# Patient Record
Sex: Male | Born: 2000 | Race: White | Hispanic: No | Marital: Single | Smoking: Never smoker
Health system: Southern US, Community
[De-identification: ages and names within clinical notes are randomized; demographics above are authoritative.]

## PROBLEM LIST (undated history)

## (undated) DIAGNOSIS — F909 Attention-deficit hyperactivity disorder, unspecified type: Secondary | ICD-10-CM

---
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
## 2020-01-03 ENCOUNTER — Encounter (INDEPENDENT_AMBULATORY_CARE_PROVIDER_SITE_OTHER): Payer: Self-pay

## 2020-01-04 ENCOUNTER — Ambulatory Visit: Payer: Self-pay

## 2020-01-07 ENCOUNTER — Encounter: Payer: Self-pay | Admitting: Physical Therapy

## 2020-01-07 ENCOUNTER — Ambulatory Visit: Payer: Self-pay | Admitting: Physical Therapy

## 2020-01-07 ENCOUNTER — Other Ambulatory Visit: Payer: Self-pay

## 2020-01-07 DIAGNOSIS — R29898 Other symptoms and signs involving the musculoskeletal system: Secondary | ICD-10-CM

## 2020-01-07 DIAGNOSIS — M25572 Pain in left ankle and joints of left foot: Secondary | ICD-10-CM

## 2020-01-07 DIAGNOSIS — M25672 Stiffness of left ankle, not elsewhere classified: Secondary | ICD-10-CM

## 2020-01-07 DIAGNOSIS — R262 Difficulty in walking, not elsewhere classified: Secondary | ICD-10-CM

## 2020-01-07 DIAGNOSIS — M25662 Stiffness of left knee, not elsewhere classified: Secondary | ICD-10-CM

## 2020-01-07 DIAGNOSIS — M25562 Pain in left knee: Secondary | ICD-10-CM

## 2020-01-07 NOTE — Therapy (Signed)
[facility] [address]  [address] [place], [place], [address] Phone: [phone]   Fax:  [phone]  Physical Therapy Treatment  Patient Details  Name: Calvin Castaneda MRN: [id number] Date of Birth: [birth year] Referring Provider (PT): Jari Sportsman, [place]   Encounter Date: 01/07/2020   PT End of Session - 01/07/20 1144    Visit Number 7    Number of Visits 13    Date for PT Re-Evaluation 01/18/20    Authorization Type UHC Medicaid    PT Start Time 1105    PT Stop Time 1143    PT Time Calculation (min) 38 min    Activity Tolerance Patient tolerated treatment well    Behavior During Therapy [facility] for tasks assessed/performed           Past Medical History:  Diagnosis Date  . ADHD     Past Surgical History:  Procedure Laterality Date  . TIBIA IM NAIL INSERTION Left 10/15/2019   Procedure: INTRAMEDULLARY (IM) NAIL TIBIAL;  Surgeon: Tarry Kos, MD;  [facility]: [facility] OR;  Service: Orthopedics;  Laterality: Left;    There were no vitals filed for this visit.   Subjective Assessment - 01/07/20 1107    Subjective Has gone to the gym 3x to work on UE strengthening and light LE strengthening. Still having a little bit of pain over the shin with HS curls. Stepped off a curb and jarred his L knee the other day but without any lasting effects.    Pertinent History ADHD    Diagnostic tests 11/24/19 L tibia/fibular xray: X-rays demonstrate stable alignment of the fractures with considerable callus formation    Patient Stated Goals "be able to walk so that I can run"    Currently in Pain? No/denies                             [facility] Adult PT Treatment/Exercise - 01/07/20 0001      Knee/Hip Exercises: Stretches   Lobbyist 2 reps;30 seconds    Quad Stretch Limitations prone with strap    Other Knee/Hip Stretches standing L quad stretch with strap 30"      Knee/Hip Exercises: Aerobic   Recumbent Bike L2 x 6  min       Knee/Hip Exercises: Standing   Step Down Left;10 reps;Hand Hold: 1;Step Height: 4";2 sets    Step Down Limitations heel touch   considerable L knee shaking    Functional Squat 1 set;15 reps    Functional Squat Limitations TRX squat with green TB above knees    Wall Squat 1 set;10 reps    Wall Squat Limitations depth to tolerance   improved wt shift   Other Standing Knee Exercises sidestepping with red loop around toes 2x66ft   cues to maintain trunk upright     Ankle Exercises: Standing   Heel Raises Both;10 reps   2 sets; B conc, L ecc, mild discomfort in L knee     Ankle Exercises: Stretches   Gastroc Stretch 2 reps;30 seconds   L LE     Ankle Exercises: Seated   Other Seated Ankle Exercises L ankle DF, INV, EV with green TB x5 each                  PT Education - 01/07/20 1144    Education Details update to HEP    Person(s) Educated Patient  Methods Explanation;Demonstration;Tactile cues;Verbal cues;Handout    Comprehension Verbalized understanding;Returned demonstration            PT Short Term Goals - 12/14/19 1201      PT SHORT TERM GOAL #1   Title Patient to be independent with initial HEP.    Time 3    Period Weeks    Status Achieved    Target Date 12/28/19             PT Long Term Goals - 12/09/19 1358      PT LONG TERM GOAL #1   Title Patient to be independent with advanced HEP.    Time 6    Period Weeks    Status On-going      PT LONG TERM GOAL #2   Title Patient to demonstrate L ankle and knee AROM WFL and without pain limiting.    Time 6    Period Weeks    Status On-going      PT LONG TERM GOAL #3   Title Patient to demonstrate B LE strength >/=4+/5.    Time 6    Period Weeks    Status On-going      PT LONG TERM GOAL #4   Title Patient to demonstrate symmetrical step length, weight shift, knee flexion, and dorsiflexion with ambulation without AD.    Time 6    Period Weeks    Status On-going      PT LONG TERM GOAL  #5   Title Patient to ascend/descend stairs without handrail and with reciprocal pattern with good quad control.    Time 6    Period Weeks    Status On-going                 Plan - 01/07/20 1144    Clinical Impression Statement Patient without new complaints today. Worked on progressive LE strengthening ther-ex this session. Patient tolerated an increase in challenge with heel raises with patient demonstrating some weakness and shaking on the L LE. Demonstrated much improved weight shift and depth with squatting activities. Also tolerated initiation of step downs with patient demonstrating considerable L knee shaking d/t muscle weakness. Mild valgus collapse also noted upon step down. Patient was able to perform 4 way ankle with increased banded resistance today, thus updated HEP with more challenging exercises which were performed today. Patient reported understanding and without complaints at end of session. Patient is progressing well towards goals.    Comorbidities ADHD    Rehab Potential Excellent    PT Frequency 2x / week    PT Treatment/Interventions ADLs/Self Care Home Management;Cryotherapy;Electrical Stimulation;Moist Heat;Balance training;Therapeutic exercise;Therapeutic activities;Functional mobility training;Stair training;Gait training;Ultrasound;Neuromuscular re-education;Patient/family education;Manual techniques;Vasopneumatic Device;Taping;Energy conservation;Dry needling;Passive range of motion;Scar mobilization    PT Next Visit Plan L quad and ankle strengthening ther-ex    Consulted and Agree with Plan of Care Patient           Patient will benefit from skilled therapeutic intervention in order to improve the following deficits and impairments:  Hypomobility, Increased edema, Decreased scar mobility, Decreased activity tolerance, Decreased strength, Pain, Increased fascial restricitons, Decreased balance, Difficulty walking, Increased muscle spasms, Improper body  mechanics, Decreased range of motion, Impaired flexibility, Postural dysfunction  Visit Diagnosis: Pain in left ankle and joints of left foot  Stiffness of left ankle, not elsewhere classified  Acute pain of left knee  Stiffness of left knee, not elsewhere classified  Difficulty in walking, not elsewhere classified  Other symptoms and signs involving the  musculoskeletal system     Problem List Patient Active Problem List   Diagnosis Date Noted  . Tibia/fibula fracture, left, closed, initial encounter 10/14/2019     Anette Guarneri, PT, DPT 01/07/20 11:49 AM   [facility] [address]  [address] [place], [place], [address] Phone: [phone]   Fax:  [phone]  Name: Calvin Castaneda MRN: [id number] Date of Birth: [birth year]

## 2020-01-11 ENCOUNTER — Other Ambulatory Visit: Payer: Self-pay

## 2020-01-11 ENCOUNTER — Ambulatory Visit: Payer: Self-pay

## 2020-01-11 DIAGNOSIS — R262 Difficulty in walking, not elsewhere classified: Secondary | ICD-10-CM

## 2020-01-11 DIAGNOSIS — M25572 Pain in left ankle and joints of left foot: Secondary | ICD-10-CM

## 2020-01-11 DIAGNOSIS — R29898 Other symptoms and signs involving the musculoskeletal system: Secondary | ICD-10-CM

## 2020-01-11 DIAGNOSIS — M25672 Stiffness of left ankle, not elsewhere classified: Secondary | ICD-10-CM

## 2020-01-11 DIAGNOSIS — M25662 Stiffness of left knee, not elsewhere classified: Secondary | ICD-10-CM

## 2020-01-11 DIAGNOSIS — M25562 Pain in left knee: Secondary | ICD-10-CM

## 2020-01-11 NOTE — Therapy (Signed)
[facility] [address]  [place], [place], [address] Phone: [phone]   Fax:  [phone]  Physical Therapy Treatment  Patient Details  Name: Calvin Castaneda MRN: [id number] Date of Birth: February 17, 2001 Referring Provider (PT): Dwana Melena, [place]   Encounter Date: 01/11/2020   PT End of Session - 01/11/20 1116    Visit Number 8    Number of Visits 13    Date for PT Re-Evaluation 01/18/20    Authorization Type UHC Medicaid    PT Start Time 1108   pt. arrived late   PT Stop Time 1159    PT Time Calculation (min) 51 min    Activity Tolerance Patient tolerated treatment well    Behavior During Therapy [facility] for tasks assessed/performed           Past Medical History:  Diagnosis Date  . ADHD     Past Surgical History:  Procedure Laterality Date  . TIBIA IM NAIL INSERTION Left 10/15/2019   Procedure: INTRAMEDULLARY (IM) NAIL TIBIAL;  Surgeon: Leandrew Koyanagi, MD;  Location: [facility];  Service: Orthopedics;  Laterality: Left;    There were no vitals filed for this visit.   Subjective Assessment - 01/11/20 1115    Subjective Doing well with no new complaints.    Pertinent History ADHD    Diagnostic tests 11/24/19 L tibia/fibular xray: X-rays demonstrate stable alignment of the fractures with considerable callus formation    Patient Stated Goals "be able to walk so that I can run"    Currently in Pain? No/denies    Pain Score 0-No pain    Multiple Pain Sites No              [facility] PT Assessment - 01/11/20 0001      Assessment   Medical Diagnosis L Tibia/fibula fx    Referring Provider (PT) Dwana Melena, PA-C    Onset Date/Surgical Date 10/15/19    Next MD Visit 01/12/20      AROM   Right/Left Knee Right;Left    Left Knee Extension 0    Left Knee Flexion 134    Right/Left Ankle Right;Left    Right Ankle Dorsiflexion 19    Left Ankle Dorsiflexion 10    Left Ankle Plantar Flexion 60    Left Ankle  Inversion 35    Left Ankle Eversion 25      Strength   Strength Assessment Site Hip;Knee;Ankle    Right/Left Hip Right;Left    Right Hip Flexion 5/5    Right Hip Extension 4+/5    Right Hip ABduction 5/5    Right Hip ADduction 4+/5    Left Hip Flexion 5/5    Left Hip Extension 4+/5    Left Hip ABduction 4+/5    Left Hip ADduction 4+/5    Right/Left Knee Right;Left    Right Knee Flexion 5/5    Right Knee Extension 5/5    Left Knee Flexion 4+/5    Left Knee Extension 4+/5    Right/Left Ankle Right;Left    Right Ankle Dorsiflexion 5/5    Right Ankle Plantar Flexion 5/5    Right Ankle Inversion 5/5    Right Ankle Eversion 5/5    Left Ankle Dorsiflexion 5/5    Left Ankle Plantar Flexion 5/5    Left Ankle Inversion 5/5    Left Ankle Eversion 5/5  [facility] Adult PT Treatment/Exercise - 01/11/20 0001      Knee/Hip Exercises: Stretches   Passive Hamstring Stretch Left;1 rep;30 seconds    Quad Stretch 2 reps;30 seconds    Quad Stretch Limitations prone with strap and bolster       Knee/Hip Exercises: Aerobic   Elliptical Lvl 3.0, 7 min       Knee/Hip Exercises: Machines for Strengthening   Cybex Leg Press L only: 25# x 15 reps      Knee/Hip Exercises: Standing   Step Down Left;10 reps;Hand Hold: 1;2 sets;Step Height: 6"    Step Down Limitations heel touch    SLS with Vectors L RDL x 5 reps                     PT Short Term Goals - 12/14/19 1201      PT SHORT TERM GOAL #1   Title Patient to be independent with initial HEP.    Time 3    Period Weeks    Status Achieved    Target Date 12/28/19             PT Long Term Goals - 01/11/20 1126      PT LONG TERM GOAL #1   Title Patient to be independent with advanced HEP.    Time 6    Period Weeks    Status Partially Met   01/11/20 met for current     PT LONG TERM GOAL #2   Title Patient to demonstrate L ankle and knee AROM WFL and without pain limiting.    Time 6     Period Weeks    Status Partially Met   01/11/20: met for L knee AROM; met for all L ankle AROM with exception of L DF     PT LONG TERM GOAL #3   Title Patient to demonstrate B LE strength >/=4+/5.    Time 6    Period Weeks    Status Achieved   01/11/20     PT LONG TERM GOAL #4   Title Patient to demonstrate symmetrical step length, weight shift, knee flexion, and dorsiflexion with ambulation without AD.    Time 6    Period Weeks    Status Partially Met   01/11/20: improved gait mechanics however some remaining limitation with L LE weight shift     PT LONG TERM GOAL #5   Title Patient to ascend/descend stairs without handrail and with reciprocal pattern with good quad control.    Time 6    Period Weeks    Status Partially Met   01/11/20; ambulating with reciprocal pattern however with limited quad control                Plan - 01/11/20 1122    Clinical Impression Statement Pt. has made good progress with physical therapy.  Now ambulating without AD and reports daily adherence to HEP.  LTG #1 partially met.  Pt. has partially met LTG #2 demonstrating L knee AROM WFL and improved L ankle AROM now only limited in DF ROM.  Pt. encouraged to continue daily calf stretching with HEP for improved ankle mobility.  Pt. has met LTG #3 as she demonstrates 4+/5-5/5 strength in B LEs with MMT.  Pt. has demonstrated much improved gait mechanics with improved B step length and posture however still with some limited L LE weight shift.  LTG #4 partially achieved.  Pt. has partially achieved LTG #5 navigating stairs reciprocally however  with limited L quad control and visible remaining calf muscle tightness contributing to poor mechanics descending.  Session focused training for eccentric quad/HS control which was tolerated well.  Pt. to see MD for f/u tomorrow morning.  Isiah will continue to benefit from further skilled therapy to maximize functional strength, ROM and return to recreational activity.      Comorbidities ADHD    Rehab Potential Excellent    PT Frequency 2x / week    PT Duration 6 weeks    PT Treatment/Interventions ADLs/Self Care Home Management;Cryotherapy;Electrical Stimulation;Moist Heat;Balance training;Therapeutic exercise;Therapeutic activities;Functional mobility training;Stair training;Gait training;Ultrasound;Neuromuscular re-education;Patient/family education;Manual techniques;Vasopneumatic Device;Taping;Energy conservation;Dry needling;Passive range of motion;Scar mobilization    PT Next Visit Plan L quad and ankle strengthening ther-ex           Patient will benefit from skilled therapeutic intervention in order to improve the following deficits and impairments:  Hypomobility, Increased edema, Decreased scar mobility, Decreased activity tolerance, Decreased strength, Pain, Increased fascial restricitons, Decreased balance, Difficulty walking, Increased muscle spasms, Improper body mechanics, Decreased range of motion, Impaired flexibility, Postural dysfunction  Visit Diagnosis: Pain in left ankle and joints of left foot  Stiffness of left ankle, not elsewhere classified  Acute pain of left knee  Stiffness of left knee, not elsewhere classified  Difficulty in walking, not elsewhere classified  Other symptoms and signs involving the musculoskeletal system     Problem List Patient Active Problem List   Diagnosis Date Noted  . Tibia/fibula fracture, left, closed, initial encounter 10/14/2019    Bess Harvest, PTA 01/11/20 1:14 PM   [facility] [address]  [place], [place], [address] Phone: [phone]   Fax:  [phone]  Name: Keyandre Pileggi MRN: [id number] Date of Birth: April 03, 2001

## 2020-01-12 ENCOUNTER — Ambulatory Visit (INDEPENDENT_AMBULATORY_CARE_PROVIDER_SITE_OTHER): Payer: Medicaid Other | Admitting: Orthopaedic Surgery

## 2020-01-12 ENCOUNTER — Ambulatory Visit (INDEPENDENT_AMBULATORY_CARE_PROVIDER_SITE_OTHER): Payer: Self-pay

## 2020-01-12 ENCOUNTER — Encounter: Payer: Self-pay | Admitting: Orthopaedic Surgery

## 2020-01-12 DIAGNOSIS — S82202A Unspecified fracture of shaft of left tibia, initial encounter for closed fracture: Secondary | ICD-10-CM

## 2020-01-12 DIAGNOSIS — S82402A Unspecified fracture of shaft of left fibula, initial encounter for closed fracture: Secondary | ICD-10-CM

## 2020-01-12 NOTE — Progress Notes (Signed)
   Post-Op Visit Note   Patient: Calvin Castaneda           Date of Birth: 2000/06/19           MRN: [id number] Visit Date: 01/12/2020 PCP: Marlena Clipper, MD   Assessment & Plan:  Chief Complaint:  Chief Complaint  Patient presents with  . Left Leg - Follow-up   Visit Diagnoses:  1. Tibia/fibula fracture, left, closed, initial encounter     Plan: Patient is a very pleasant 19 year old who comes in today with his mom. He is 12 weeks status post left tibial IM nail 10/15/2019. Has been doing very good. He has minimal pain which only occurs with hamstring curls. Examination of his left knee reveals range of motion for about 5 degrees of extension to 115 degrees of flexion. There is no effusion. He has 4 out of 5 strength with resisted straight leg raise. He is neurovascular intact distally. At this point, he will continue with physical therapy to work on range of motion strengthening. We will continue to advance with activity as tolerated as his fracture has considerable callus formation. We will follow up with [place] in 2 months time for repeat evaluation and 2 view x-rays of the left tibia/fibula.  Follow-Up Instructions: Return in about 2 months (around 03/13/2020).   Orders:  Orders Placed This Encounter  Procedures  . XR Tibia/Fibula Left   No orders of the defined types were placed in this encounter.   Imaging: XR Tibia/Fibula Left  Result Date: 01/12/2020 X-rays demonstrate stable alignment of the fractures with considerable callus formation and bony consolidation   PMFS History: Patient Active Problem List   Diagnosis Date Noted  . Tibia/fibula fracture, left, closed, initial encounter 10/14/2019   Past Medical History:  Diagnosis Date  . ADHD     History reviewed. No pertinent family history.  Past Surgical History:  Procedure Laterality Date  . TIBIA IM NAIL INSERTION Left 10/15/2019   Procedure: INTRAMEDULLARY (IM) NAIL TIBIAL;  Surgeon: Tarry Kos, MD;   [facility]: [facility];  Service: Orthopedics;  Laterality: Left;   Social History   Occupational History  . Not on file  Tobacco Use  . Smoking status: Never Smoker  . Smokeless tobacco: Never Used  Substance and Sexual Activity  . Alcohol use: No  . Drug use: No  . Sexual activity: Not on file

## 2020-01-14 ENCOUNTER — Other Ambulatory Visit: Payer: Self-pay

## 2020-01-14 ENCOUNTER — Ambulatory Visit: Payer: Self-pay

## 2020-01-14 DIAGNOSIS — R29898 Other symptoms and signs involving the musculoskeletal system: Secondary | ICD-10-CM

## 2020-01-14 DIAGNOSIS — M25672 Stiffness of left ankle, not elsewhere classified: Secondary | ICD-10-CM

## 2020-01-14 DIAGNOSIS — M25662 Stiffness of left knee, not elsewhere classified: Secondary | ICD-10-CM

## 2020-01-14 DIAGNOSIS — M25562 Pain in left knee: Secondary | ICD-10-CM

## 2020-01-14 DIAGNOSIS — M25572 Pain in left ankle and joints of left foot: Secondary | ICD-10-CM

## 2020-01-14 DIAGNOSIS — R262 Difficulty in walking, not elsewhere classified: Secondary | ICD-10-CM

## 2020-01-14 NOTE — Therapy (Signed)
[facility] [address]  [place], [place], [address] Phone: [phone]   Fax:  [phone]  Physical Therapy Treatment  Patient Details  Name: Calvin Castaneda MRN: [id number] Date of Birth: 12-Aug-2000 Referring Provider (PT): Dwana Melena, [place]   Encounter Date: 01/14/2020   PT End of Session - 01/14/20 1108    Visit Number 9    Number of Visits 13    Date for PT Re-Evaluation 01/18/20    Authorization Type [employer] Medicaid    PT Start Time 1105    PT Stop Time 1145    PT Time Calculation (min) 40 min    Activity Tolerance Patient tolerated treatment well    Behavior During Therapy [facility] for tasks assessed/performed           Past Medical History:  Diagnosis Date  . ADHD     Past Surgical History:  Procedure Laterality Date  . TIBIA IM NAIL INSERTION Left 10/15/2019   Procedure: INTRAMEDULLARY (IM) NAIL TIBIAL;  Surgeon: Leandrew Koyanagi, MD;  Location: [place];  Service: Orthopedics;  Laterality: Left;    There were no vitals filed for this visit.   Subjective Assessment - 01/14/20 1106    Subjective Doing ok with no new complaints.    Pertinent History ADHD    Diagnostic tests 11/24/19 L tibia/fibular xray: X-rays demonstrate stable alignment of the fractures with considerable callus formation    Patient Stated Goals "be able to walk so that I can run"    Currently in Pain? No/denies    Pain Score 0-No pain    Multiple Pain Sites No                             [facility] Adult PT Treatment/Exercise - 01/14/20 0001      Knee/Hip Exercises: Aerobic   Tread Mill Backwards TM walking with machine off 3 x 20 sec       Knee/Hip Exercises: Machines for Strengthening   Cybex Leg Press B LE: 45# x 15 reps      Knee/Hip Exercises: Standing   Hip Flexion Right;Left;10 reps;Knee straight    Hip Flexion Limitations red TB at ankle    Hip ADduction Right;Left;10 reps;Strengthening    Hip ADduction  Limitations red TB at ankle    Hip Abduction Right;Left;10 reps;Knee straight;Stengthening    Abduction Limitations red TB at ankle    Hip Extension Left;10 reps;Both;Knee straight;Stengthening    Extension Limitations red TB at ankle    SLS with Vectors L RDL x 10 reps     Walking with Sports Cord All directions mod resistance x 90 ft in clinic       Knee/Hip Exercises: Prone   Other Prone Exercises Prone plank + alternating hip extension kick x 10 reps   cues for L TKE                   PT Short Term Goals - 12/14/19 1201      PT SHORT TERM GOAL #1   Title Patient to be independent with initial HEP.    Time 3    Period Weeks    Status Achieved    Target Date 12/28/19             PT Long Term Goals - 01/11/20 1126      PT LONG TERM GOAL #1   Title Patient to be independent with  advanced HEP.    Time 6    Period Weeks    Status Partially Met   01/11/20 met for current     PT LONG TERM GOAL #2   Title Patient to demonstrate L ankle and knee AROM WFL and without pain limiting.    Time 6    Period Weeks    Status Partially Met   01/11/20: met for L knee AROM; met for all L ankle AROM with exception of L DF     PT LONG TERM GOAL #3   Title Patient to demonstrate B LE strength >/=4+/5.    Time 6    Period Weeks    Status Achieved   01/11/20     PT LONG TERM GOAL #4   Title Patient to demonstrate symmetrical step length, weight shift, knee flexion, and dorsiflexion with ambulation without AD.    Time 6    Period Weeks    Status Partially Met   01/11/20: improved gait mechanics however some remaining limitation with L LE weight shift     PT LONG TERM GOAL #5   Title Patient to ascend/descend stairs without handrail and with reciprocal pattern with good quad control.    Time 6    Period Weeks    Status Partially Met   01/11/20; ambulating with reciprocal pattern however with limited quad control                Plan - 01/14/20 1109    Clinical  Impression Statement Frederico with no new complaints.  Tolerated progression of proximal hip/ankle strengthening with 4-way hip kicker with red TB resistance well.  Progressed HS and quad strengthening with L single leg RDL and backwards quad walk on Treadmill (machine off) without pain.  Ended visit pain free.    Comorbidities ADHD    Rehab Potential Excellent    PT Frequency 2x / week    PT Treatment/Interventions ADLs/Self Care Home Management;Cryotherapy;Electrical Stimulation;Moist Heat;Balance training;Therapeutic exercise;Therapeutic activities;Functional mobility training;Stair training;Gait training;Ultrasound;Neuromuscular re-education;Patient/family education;Manual techniques;Vasopneumatic Device;Taping;Energy conservation;Dry needling;Passive range of motion;Scar mobilization    PT Next Visit Plan L quad and ankle strengthening ther-ex    Consulted and Agree with Plan of Care Patient           Patient will benefit from skilled therapeutic intervention in order to improve the following deficits and impairments:  Hypomobility, Increased edema, Decreased scar mobility, Decreased activity tolerance, Decreased strength, Pain, Increased fascial restricitons, Decreased balance, Difficulty walking, Increased muscle spasms, Improper body mechanics, Decreased range of motion, Impaired flexibility, Postural dysfunction  Visit Diagnosis: Pain in left ankle and joints of left foot  Stiffness of left ankle, not elsewhere classified  Acute pain of left knee  Stiffness of left knee, not elsewhere classified  Difficulty in walking, not elsewhere classified  Other symptoms and signs involving the musculoskeletal system     Problem List Patient Active Problem List   Diagnosis Date Noted  . Tibia/fibula fracture, left, closed, initial encounter 10/14/2019    Bess Harvest, PTA 01/14/20 11:56 AM   [facility] [address]  [place], [place], [address] Phone: [phone]   Fax:  [phone]  Name: Calvin Castaneda MRN: [id number] Date of Birth: June 15, 2000

## 2020-01-18 ENCOUNTER — Ambulatory Visit: Payer: Medicaid Other

## 2020-01-18 ENCOUNTER — Other Ambulatory Visit: Payer: Self-pay

## 2020-01-18 DIAGNOSIS — R262 Difficulty in walking, not elsewhere classified: Secondary | ICD-10-CM

## 2020-01-18 DIAGNOSIS — M25572 Pain in left ankle and joints of left foot: Secondary | ICD-10-CM

## 2020-01-18 DIAGNOSIS — M25672 Stiffness of left ankle, not elsewhere classified: Secondary | ICD-10-CM

## 2020-01-18 DIAGNOSIS — M25662 Stiffness of left knee, not elsewhere classified: Secondary | ICD-10-CM

## 2020-01-18 DIAGNOSIS — R29898 Other symptoms and signs involving the musculoskeletal system: Secondary | ICD-10-CM

## 2020-01-18 DIAGNOSIS — M25562 Pain in left knee: Secondary | ICD-10-CM

## 2020-01-18 NOTE — Therapy (Addendum)
[facility] [address]  [place], [place], [address] Phone: [phone]   Fax:  [phone]  Physical Therapy Treatment  Patient Details  Name: Calvin Castaneda MRN: [id number] Date of Birth: Feb 08, 2001 Referring Provider (PT): Dwana Melena, [place]   Encounter Date: 01/18/2020   PT End of Session - 01/18/20 1111    Visit Number 10    Number of Visits 13    Date for PT Re-Evaluation 01/18/20    Authorization Type [employer] Medicaid    PT Start Time 1107    PT Stop Time 1150    PT Time Calculation (min) 43 min    Activity Tolerance Patient tolerated treatment well    Behavior During Therapy [facility] for tasks assessed/performed           Past Medical History:  Diagnosis Date  . ADHD     Past Surgical History:  Procedure Laterality Date  . TIBIA IM NAIL INSERTION Left 10/15/2019   Procedure: INTRAMEDULLARY (IM) NAIL TIBIAL;  Surgeon: Leandrew Koyanagi, MD;  Location: [facility] [facility];  Service: Orthopedics;  Laterality: Left;    There were no vitals filed for this visit.   Subjective Assessment - 01/18/20 1110    Subjective Pt. noting he felt fatigue yesterday however this subsided today without known trigger.    Pertinent History ADHD    Diagnostic tests 11/24/19 L tibia/fibular xray: X-rays demonstrate stable alignment of the fractures with considerable callus formation    Patient Stated Goals "be able to walk so that I can run"    Currently in Pain? No/denies    Pain Score 0-No pain    Pain Location Knee                             [facility] Adult PT Treatment/Exercise - 01/18/20 0001      Knee/Hip Exercises: Aerobic   Elliptical Lvl 3.0, 7 min       Knee/Hip Exercises: Standing   Step Down Left;10 reps;Hand Hold: 1;Step Height: 6"    Step Down Limitations heel touch    Other Standing Knee Exercises Forward backwards, R/L weight shift on BOS ball (down) x 10 reach way     Other Standing Knee Exercises BOSU  ball balance (down) + ball toss x 1 min       Knee/Hip Exercises: Supine   Bridges Both;15 reps;Strengthening    Bridges Limitations 10" hold    Single Leg Bridge Right;Left;10 reps      Knee/Hip Exercises: Sidelying   Other Sidelying Knee/Hip Exercises B side plank + side leg raise x 10                     PT Short Term Goals - 12/14/19 1201      PT SHORT TERM GOAL #1   Title Patient to be independent with initial HEP.    Time 3    Period Weeks    Status Achieved    Target Date 12/28/19             PT Long Term Goals - 01/11/20 1126      PT LONG TERM GOAL #1   Title Patient to be independent with advanced HEP.    Time 6    Period Weeks    Status Partially Met   01/11/20 met for current     PT LONG TERM GOAL #2   Title Patient to  demonstrate L ankle and knee AROM WFL and without pain limiting.    Time 6    Period Weeks    Status Partially Met   01/11/20: met for L knee AROM; met for all L ankle AROM with exception of L DF     PT LONG TERM GOAL #3   Title Patient to demonstrate B LE strength >/=4+/5.    Time 6    Period Weeks    Status Achieved   01/11/20     PT LONG TERM GOAL #4   Title Patient to demonstrate symmetrical step length, weight shift, knee flexion, and dorsiflexion with ambulation without AD.    Time 6    Period Weeks    Status Partially Met   01/11/20: improved gait mechanics however some remaining limitation with L LE weight shift     PT LONG TERM GOAL #5   Title Patient to ascend/descend stairs without handrail and with reciprocal pattern with good quad control.    Time 6    Period Weeks    Status Partially Met   01/11/20; ambulating with reciprocal pattern however with limited quad control                Plan - 01/18/20 1112    Clinical Impression Statement Calvin Castaneda doing well.  Notes fatigue yesterday without known trigger which subsided today.  Progressed quad and HS strengthening along with proximal hip strengthening  without issue.  Initiated BOSU balance training with mild L LE instability noted however good tolerance for task.  Ended visit pain free.    Comorbidities ADHD    Rehab Potential Excellent    PT Frequency 2x / week    PT Duration 6 weeks    PT Treatment/Interventions ADLs/Self Care Home Management;Cryotherapy;Electrical Stimulation;Moist Heat;Balance training;Therapeutic exercise;Therapeutic activities;Functional mobility training;Stair training;Gait training;Ultrasound;Neuromuscular re-education;Patient/family education;Manual techniques;Vasopneumatic Device;Taping;Energy conservation;Dry needling;Passive range of motion;Scar mobilization    PT Next Visit Plan L quad and ankle strengthening ther-ex    Consulted and Agree with Plan of Care Patient           Patient will benefit from skilled therapeutic intervention in order to improve the following deficits and impairments:  Hypomobility, Increased edema, Decreased scar mobility, Decreased activity tolerance, Decreased strength, Pain, Increased fascial restricitons, Decreased balance, Difficulty walking, Increased muscle spasms, Improper body mechanics, Decreased range of motion, Impaired flexibility, Postural dysfunction  Visit Diagnosis: Pain in left ankle and joints of left foot  Stiffness of left ankle, not elsewhere classified  Acute pain of left knee  Stiffness of left knee, not elsewhere classified  Difficulty in walking, not elsewhere classified  Other symptoms and signs involving the musculoskeletal system     Problem List Patient Active Problem List   Diagnosis Date Noted  . Tibia/fibula fracture, left, closed, initial encounter 10/14/2019    Bess Harvest, PTA 01/18/20 12:13 PM   [facility] [address]  [place], [place], [address] Phone: [phone]   Fax:  [phone]  Name: Calvin Castaneda MRN: [id number] Date of Birth: [birth year]

## 2020-01-21 ENCOUNTER — Other Ambulatory Visit: Payer: Self-pay

## 2020-01-21 ENCOUNTER — Encounter: Payer: Self-pay | Admitting: Physical Therapy

## 2020-01-21 ENCOUNTER — Ambulatory Visit: Payer: Self-pay | Attending: Physician Assistant | Admitting: Physical Therapy

## 2020-01-21 DIAGNOSIS — M25672 Stiffness of left ankle, not elsewhere classified: Secondary | ICD-10-CM | POA: Insufficient documentation

## 2020-01-21 DIAGNOSIS — R29898 Other symptoms and signs involving the musculoskeletal system: Secondary | ICD-10-CM | POA: Insufficient documentation

## 2020-01-21 DIAGNOSIS — R262 Difficulty in walking, not elsewhere classified: Secondary | ICD-10-CM | POA: Insufficient documentation

## 2020-01-21 DIAGNOSIS — M25572 Pain in left ankle and joints of left foot: Secondary | ICD-10-CM | POA: Insufficient documentation

## 2020-01-21 DIAGNOSIS — M25562 Pain in left knee: Secondary | ICD-10-CM | POA: Insufficient documentation

## 2020-01-21 DIAGNOSIS — M25662 Stiffness of left knee, not elsewhere classified: Secondary | ICD-10-CM | POA: Insufficient documentation

## 2020-01-21 NOTE — Therapy (Signed)
[facility] [address]  [place], [place], [address] Phone: [phone]   Fax:  [phone]  Physical Therapy Treatment  Patient Details  Name: Calvin Castaneda MRN: [id number] Date of Birth: Apr 16, 2001 Referring Provider (PT): Dwana Melena, [place]   Encounter Date: 01/21/2020   PT End of Session - 01/21/20 1146    Visit Number 11    Number of Visits 13    Date for PT Re-Evaluation 01/18/20    Authorization Type [employer]HC Medicaid    PT Start Time 1107    PT Stop Time 1145    PT Time Calculation (min) 38 min    Activity Tolerance Patient tolerated treatment well    Behavior During Therapy [facility] for tasks assessed/performed           Past Medical History:  Diagnosis Date  . ADHD     Past Surgical History:  Procedure Laterality Date  . TIBIA IM NAIL INSERTION Left 10/15/2019   Procedure: INTRAMEDULLARY (IM) NAIL TIBIAL;  Surgeon: Leandrew Koyanagi, MD;  Location: [place];  Service: Orthopedics;  Laterality: Left;    There were no vitals filed for this visit.   Subjective Assessment - 01/21/20 1110    Subjective Not much new since last session. L knee is feeling a little stiff.    Pertinent History ADHD    Diagnostic tests 11/24/19 L tibia/fibular xray: X-rays demonstrate stable alignment of the fractures with considerable callus formation    Patient Stated Goals "be able to walk so that I can run"    Currently in Pain? No/denies                             [facility] Adult PT Treatment/Exercise - 01/21/20 0001      Knee/Hip Exercises: Aerobic   Elliptical Lvl 3.0, 6 min       Knee/Hip Exercises: Standing   Functional Squat 1 set;10 reps    Functional Squat Limitations L single leg squat with TRX, 1 airex, 1 pillow   L knee shaking and slight valgus collapse   Other Standing Knee Exercises R/L split squat x10 each LE   cues to descend vertically; more difficulty L LE back     Ankle Exercises: Stretches     Gastroc Stretch 2 reps;30 seconds   toes on wall     Ankle Exercises: Standing   Vector Stance Left;4 reps   cues to contract core for improved stability   Heel Raises Left;10 reps   2x10; cues to avoid excessive UE use   Other Standing Ankle Exercises L single leg RDL to touch blue bolster 2x10   L knee instability; cueing for form                 PT Education - 01/21/20 1146    Education Details update to HEP    Person(s) Educated Patient    Methods Explanation;Demonstration;Tactile cues;Handout;Verbal cues    Comprehension Verbalized understanding;Returned demonstration            PT Short Term Goals - 12/14/19 1201      PT SHORT TERM GOAL #1   Title Patient to be independent with initial HEP.    Time 3    Period Weeks    Status Achieved    Target Date 12/28/19             PT Long Term Goals - 01/11/20 1126  PT LONG TERM GOAL #1   Title Patient to be independent with advanced HEP.    Time 6    Period Weeks    Status Partially Met   01/11/20 met for current     PT LONG TERM GOAL #2   Title Patient to demonstrate L ankle and knee AROM WFL and without pain limiting.    Time 6    Period Weeks    Status Partially Met   01/11/20: met for L knee AROM; met for all L ankle AROM with exception of L DF     PT LONG TERM GOAL #3   Title Patient to demonstrate B LE strength >/=4+/5.    Time 6    Period Weeks    Status Achieved   01/11/20     PT LONG TERM GOAL #4   Title Patient to demonstrate symmetrical step length, weight shift, knee flexion, and dorsiflexion with ambulation without AD.    Time 6    Period Weeks    Status Partially Met   01/11/20: improved gait mechanics however some remaining limitation with L LE weight shift     PT LONG TERM GOAL #5   Title Patient to ascend/descend stairs without handrail and with reciprocal pattern with good quad control.    Time 6    Period Weeks    Status Partially Met   01/11/20; ambulating with reciprocal  pattern however with limited quad control                Plan - 01/21/20 1146    Clinical Impression Statement Patient without new complaints today. Increased challenge with L gastroc strengthening with patient initially demonstrating excessive UE use, with improved with cues. Single leg stability training was progressed with SLS to vectors and single leg RDLs. Patient demonstrated some difficulty performing proper form with RDLs and with considerable L knee instability/shaking with both of these exercises. Split squats were initiated with patient demonstrating quad weakness and shaking, L >R. However, demonstrated good effort to prevent excessive anterior weight shift over front foot. Also challenged L LE strength with supported single le g squat- patient demonstrating minor knee instability and slight valgus positioning. Updated HEP with exercises that were well-tolerated today. Patient reported understanding and without complaints at end of session.    Comorbidities ADHD    Rehab Potential Excellent    PT Frequency 2x / week    PT Duration 6 weeks    PT Treatment/Interventions ADLs/Self Care Home Management;Cryotherapy;Electrical Stimulation;Moist Heat;Balance training;Therapeutic exercise;Therapeutic activities;Functional mobility training;Stair training;Gait training;Ultrasound;Neuromuscular re-education;Patient/family education;Manual techniques;Vasopneumatic Device;Taping;Energy conservation;Dry needling;Passive range of motion;Scar mobilization    PT Next Visit Plan L quad and ankle strengthening ther-ex    Consulted and Agree with Plan of Care Patient           Patient will benefit from skilled therapeutic intervention in order to improve the following deficits and impairments:  Hypomobility, Increased edema, Decreased scar mobility, Decreased activity tolerance, Decreased strength, Pain, Increased fascial restricitons, Decreased balance, Difficulty walking, Increased muscle spasms,  Improper body mechanics, Decreased range of motion, Impaired flexibility, Postural dysfunction  Visit Diagnosis: Pain in left ankle and joints of left foot  Stiffness of left ankle, not elsewhere classified  Acute pain of left knee  Stiffness of left knee, not elsewhere classified  Difficulty in walking, not elsewhere classified  Other symptoms and signs involving the musculoskeletal system     Problem List Patient Active Problem List   Diagnosis Date Noted  . Tibia/fibula  fracture, left, closed, initial encounter 10/14/2019     Janene Harvey, PT, DPT 01/21/20 11:54 AM   [facility] [place], [place], [address] Phone: [phone]   Fax:  [phone]  Name: Calvin Castaneda MRN: [id number] Date of Birth: 09/23/2000

## 2020-01-26 ENCOUNTER — Ambulatory Visit: Payer: Self-pay

## 2020-01-26 ENCOUNTER — Other Ambulatory Visit: Payer: Self-pay

## 2020-01-26 DIAGNOSIS — M25672 Stiffness of left ankle, not elsewhere classified: Secondary | ICD-10-CM

## 2020-01-26 DIAGNOSIS — R262 Difficulty in walking, not elsewhere classified: Secondary | ICD-10-CM

## 2020-01-26 DIAGNOSIS — M25572 Pain in left ankle and joints of left foot: Secondary | ICD-10-CM

## 2020-01-26 DIAGNOSIS — M25562 Pain in left knee: Secondary | ICD-10-CM

## 2020-01-26 DIAGNOSIS — R29898 Other symptoms and signs involving the musculoskeletal system: Secondary | ICD-10-CM

## 2020-01-26 DIAGNOSIS — M25662 Stiffness of left knee, not elsewhere classified: Secondary | ICD-10-CM

## 2020-01-26 NOTE — Therapy (Signed)
[facility] [place] [address]  [address] [place], [place], [address] Phone: [phone]   Fax:  [phone]  Physical Therapy Treatment  Patient Details  Name: Calvin Castaneda MRN: [id number] Date of Birth: 03/12/2001 Referring Provider (PT): Mary Stanbery, PA-C   Encounter Date: 01/26/2020   PT End of Session - 01/26/20 0947    Visit Number 12    Number of Visits 13    Date for PT Re-Evaluation 01/18/20    Authorization Type [employer] Medicaid    PT Start Time 0938   pt. arrived late   PT Stop Time 1023    PT Time Calculation (min) 45 min    Activity Tolerance Patient tolerated treatment well    Behavior During Therapy WFL for tasks assessed/performed           Past Medical History:  Diagnosis Date  . ADHD     Past Surgical History:  Procedure Laterality Date  . TIBIA IM NAIL INSERTION Left 10/15/2019   Procedure: INTRAMEDULLARY (IM) NAIL TIBIAL;  Surgeon: Xu, Naiping M, MD;  [facility]: [facility];  Service: Orthopedics;  Laterality: Left;    There were no vitals filed for this visit.   Subjective Assessment - 01/26/20 0945    Subjective Doing ok.  Still strength training for LE in gym.    Pertinent History ADHD    Diagnostic tests 11/24/19 L tibia/fibular xray: X-rays demonstrate stable alignment of the fractures with considerable callus formation    Patient Stated Goals "be able to walk so that I can run"    Currently in Pain? No/denies    Pain Score 0-No pain    Pain Location Knee    Pain Orientation Left    Multiple Pain Sites No                             [facility] Adult PT Treatment/Exercise - 01/26/20 0001      Self-Care   Self-Care Other Self-Care Comments    Other Self-Care Comments  instruction with pt. on principles of progressive overload to avoid re-injury when training for muscular hypertrophy in gym with LE strength training;       Knee/Hip Exercises: Aerobic   Elliptical Lvl 3.0, 6 min        Knee/Hip Exercises: Machines for Strengthening   Cybex Knee Extension B con/L ecc 20# x 15 reps     Cybex Leg Press R/L single LE: 35# x 15 reps      Knee/Hip Exercises: Standing   Forward Lunges Right;Left;10 reps    Forward Lunges Limitations lunge in place     Functional Squat 1 set;10 reps    Functional Squat Limitations TRX - 5 heel raises at bottom of squat     SLS L SL RDL reach to table x 10    SLS with Vectors L SL squat to chair + airex pad x 10    Other Standing Knee Exercises R/L split squat x10 each LE with back LE elevated                   PT Education - 01/26/20 1204    Education Details HEP update    Person(s) Educated Patient    Methods Explanation;Demonstration;Verbal cues;Handout    Comprehension Verbalized understanding;Returned demonstration;Verbal cues required            PT Short Term Goals - 12/14/19 1201        PT SHORT TERM GOAL #1   Title Patient to be independent with initial HEP.    Time 3    Period Weeks    Status Achieved    Target Date 12/28/19             PT Long Term Goals - 01/11/20 1126      PT LONG TERM GOAL #1   Title Patient to be independent with advanced HEP.    Time 6    Period Weeks    Status Partially Met   01/11/20 met for current     PT LONG TERM GOAL #2   Title Patient to demonstrate L ankle and knee AROM WFL and without pain limiting.    Time 6    Period Weeks    Status Partially Met   01/11/20: met for L knee AROM; met for all L ankle AROM with exception of L DF     PT LONG TERM GOAL #3   Title Patient to demonstrate B LE strength >/=4+/5.    Time 6    Period Weeks    Status Achieved   01/11/20     PT LONG TERM GOAL #4   Title Patient to demonstrate symmetrical step length, weight shift, knee flexion, and dorsiflexion with ambulation without AD.    Time 6    Period Weeks    Status Partially Met   01/11/20: improved gait mechanics however some remaining limitation with L LE weight shift     PT LONG  TERM GOAL #5   Title Patient to ascend/descend stairs without handrail and with reciprocal pattern with good quad control.    Time 6    Period Weeks    Status Partially Met   01/11/20; ambulating with reciprocal pattern however with limited quad control                Plan - 01/26/20 0948    Clinical Impression Statement Calvin Castaneda doing well.  Notes some knee soreness occasionally with use of elliptical in local gym however did not report pain in today's session with LE strength training.  Focused session on dynamic L single leg strength training with single leg RDL, Bulgarian Split Squats, Single leg squat to chair for improved L LE control.  Pt. with visible "shaking" of L LE during these tasks however able to demo good overall knee control with intermittent cueing for slow pacing.  Pt. continues to exert good effort in session and cautioned to use principles of progressive overload with gradual repetition and weight increased as he trains his LE in local gym to avoid injury.  Pt. verbalized understanding and issued updated HEP focusing on machine LE strengthening activities.  Pt. verbalizing he may be ready to transition to home/gym program after next session.    Comorbidities ADHD    Rehab Potential Excellent    PT Frequency 2x / week    PT Duration 6 weeks    PT Treatment/Interventions ADLs/Self Care Home Management;Cryotherapy;Electrical Stimulation;Moist Heat;Balance training;Therapeutic exercise;Therapeutic activities;Functional mobility training;Stair training;Gait training;Ultrasound;Neuromuscular re-education;Patient/family education;Manual techniques;Vasopneumatic Device;Taping;Energy conservation;Dry needling;Passive range of motion;Scar mobilization    PT Next Visit Plan possible hold vs d/c    Consulted and Agree with Plan of Care Patient           Patient will benefit from skilled therapeutic intervention in order to improve the following deficits and impairments:   Hypomobility, Increased edema, Decreased scar mobility, Decreased activity tolerance, Decreased strength, Pain, Increased fascial restricitons, Decreased balance, Difficulty walking, Increased   muscle spasms, Improper body mechanics, Decreased range of motion, Impaired flexibility, Postural dysfunction  Visit Diagnosis: Pain in left ankle and joints of left foot  Stiffness of left ankle, not elsewhere classified  Acute pain of left knee  Stiffness of left knee, not elsewhere classified  Difficulty in walking, not elsewhere classified  Other symptoms and signs involving the musculoskeletal system     Problem List Patient Active Problem List   Diagnosis Date Noted  . Tibia/fibula fracture, left, closed, initial encounter 10/14/2019    Bess Harvest, PTA 01/26/20 12:04 PM   [facility] [place] [address]  [place], [place], [address] Phone: [phone]   Fax:  [phone]  Name: Calvin Castaneda MRN: [id number] Date of Birth: 2000-09-14

## 2020-01-28 ENCOUNTER — Ambulatory Visit: Payer: Self-pay

## 2020-01-28 ENCOUNTER — Other Ambulatory Visit: Payer: Self-pay

## 2020-01-28 DIAGNOSIS — R262 Difficulty in walking, not elsewhere classified: Secondary | ICD-10-CM

## 2020-01-28 DIAGNOSIS — M25572 Pain in left ankle and joints of left foot: Secondary | ICD-10-CM

## 2020-01-28 DIAGNOSIS — M25672 Stiffness of left ankle, not elsewhere classified: Secondary | ICD-10-CM

## 2020-01-28 DIAGNOSIS — M25562 Pain in left knee: Secondary | ICD-10-CM

## 2020-01-28 DIAGNOSIS — R29898 Other symptoms and signs involving the musculoskeletal system: Secondary | ICD-10-CM

## 2020-01-28 DIAGNOSIS — M25662 Stiffness of left knee, not elsewhere classified: Secondary | ICD-10-CM

## 2020-01-28 NOTE — Therapy (Addendum)
[facility] [place] [address]  [place], [place], [address] Phone: [phone]   Fax:  [phone]  Physical Therapy Treatment  Patient Details  Name: Calvin Castaneda MRN: [id number] Date of Birth: [birth year] Referring Provider (PT): Dwana Melena, [place]   Encounter Date: 01/28/2020   PT End of Session - 01/28/20 0924    Visit Number 13    Number of Visits 13    Date for PT Re-Evaluation 01/18/20    Authorization Type [employer] Medicaid    PT Start Time 0850    PT Stop Time 0928    PT Time Calculation (min) 38 min    Activity Tolerance Patient tolerated treatment well    Behavior During Therapy [facility] for tasks assessed/performed           Past Medical History:  Diagnosis Date  . ADHD     Past Surgical History:  Procedure Laterality Date  . TIBIA IM NAIL INSERTION Left 10/15/2019   Procedure: INTRAMEDULLARY (IM) NAIL TIBIAL;  Surgeon: Leandrew Koyanagi, MD;  Location: [facility];  Service: Orthopedics;  Laterality: Left;    There were no vitals filed for this visit.   Subjective Assessment - 01/28/20 0855    Subjective wanting to go on 30-day hold from therapy.    Pertinent History ADHD    Diagnostic tests 11/24/19 L tibia/fibular xray: X-rays demonstrate stable alignment of the fractures with considerable callus formation    Patient Stated Goals "be able to walk so that I can run"    Currently in Pain? No/denies    Pain Score 0-No pain    Pain Location Knee              [facility] PT Assessment - 01/28/20 0001      AROM   AROM Assessment Site Knee;Ankle    Right/Left Knee Left    Left Knee Extension 0    Left Knee Flexion 135    Right/Left Ankle Left    Left Ankle Dorsiflexion 18    Left Ankle Plantar Flexion 60    Left Ankle Inversion 35    Left Ankle Eversion 30                         [facility] Adult PT Treatment/Exercise - 01/28/20 0001      Ambulation/Gait   Stairs Yes    Stairs Assistance 5:  Supervision    Stairs Assistance Details (indicate cue type and reason) cues for "soft" landing/eccentric quad control descending - pain free and able to descend reciprocally    Stair Management Technique No rails;Alternating pattern    Number of Stairs 14    Height of Stairs 7      Self-Care   Self-Care Other Self-Care Comments    Other Self-Care Comments  further discussed gym program and multi-joint compound lifts per pt. request; principles of safe progressive overload for resistance training with free weights; proper depth progression for squats      Knee/Hip Exercises: Standing   Forward Lunges Right;Left;10 reps    Forward Lunges Limitations lunge in place    + TRX support - improved technique      Knee/Hip Exercises: Supine   Single Leg Bridge Right;Left;10 reps;2 sets;Strengthening   cues for full hip ext. ROM                 PT Education - 01/28/20 0955    Education Details HEP update; side plank +  green TB clam shell    Person(s) Educated Patient    Methods Explanation;Demonstration;Verbal cues;Handout    Comprehension Verbalized understanding;Returned demonstration;Verbal cues required            PT Short Term Goals - 12/14/19 1201      PT SHORT TERM GOAL #1   Title Patient to be independent with initial HEP.    Time 3    Period Weeks    Status Achieved    Target Date 12/28/19             PT Long Term Goals - 01/28/20 0909      PT LONG TERM GOAL #1   Title Patient to be independent with advanced HEP.    Time 6    Period Weeks    Status Partially Met   01/11/20 met for current     PT LONG TERM GOAL #2   Title Patient to demonstrate L ankle and knee AROM WFL and without pain limiting.    Time 6    Period Weeks    Status Achieved      PT LONG TERM GOAL #3   Title Patient to demonstrate B LE strength >/=4+/5.    Time 6    Period Weeks    Status Achieved   01/11/20     PT LONG TERM GOAL #4   Title Patient to demonstrate symmetrical step  length, weight shift, knee flexion, and dorsiflexion with ambulation without AD.    Time 6    Period Weeks    Status Achieved   01/28/20     PT LONG TERM GOAL #5   Title Patient to ascend/descend stairs without handrail and with reciprocal pattern with good quad control.    Time 6    Period Weeks    Status Partially Met   01/11/20; ambulating with reciprocal pattern however still with limited quad control descending - improved from when last tested                Plan - 01/28/20 0925    Clinical Impression Statement Calvin Castaneda has made good progress with PT.  Pt. able to achieve LTG #2 demonstrating L knee/ankle AROM WFL now.  Has previously met LTG #3 strength goal on 08/24 demonstrating improved L quad eccentric control descending stairs.  Still demonstrating some lack of eccentric control descending stairs today thus LTG #5 partially achieved.  LTG #4 met as pt. now demonstrating symmetrical gait pattern with good weight shift over L LE.  Reviewed comprehensive HEP with instruction on proper gym strengthening activity progression with pt. verbalizing understanding.  HEP updated.  Pt. opting for 30-day hold from therapy and supervising PT approving this plan.  Pt. now on 30-day hold.    Comorbidities ADHD    Rehab Potential Excellent    PT Frequency 2x / week    PT Duration 6 weeks    PT Treatment/Interventions ADLs/Self Care Home Management;Cryotherapy;Electrical Stimulation;Moist Heat;Balance training;Therapeutic exercise;Therapeutic activities;Functional mobility training;Stair training;Gait training;Ultrasound;Neuromuscular re-education;Patient/family education;Manual techniques;Vasopneumatic Device;Taping;Energy conservation;Dry needling;Passive range of motion;Scar mobilization    PT Next Visit Plan 30-day hold    Consulted and Agree with Plan of Care Patient           Patient will benefit from skilled therapeutic intervention in order to improve the following deficits and  impairments:  Hypomobility, Increased edema, Decreased scar mobility, Decreased activity tolerance, Decreased strength, Pain, Increased fascial restricitons, Decreased balance, Difficulty walking, Increased muscle spasms, Improper body mechanics, Decreased range of motion, Impaired  flexibility, Postural dysfunction  Visit Diagnosis: Pain in left ankle and joints of left foot  Stiffness of left ankle, not elsewhere classified  Acute pain of left knee  Stiffness of left knee, not elsewhere classified  Difficulty in walking, not elsewhere classified  Other symptoms and signs involving the musculoskeletal system     Problem List Patient Active Problem List   Diagnosis Date Noted  . Tibia/fibula fracture, left, closed, initial encounter 10/14/2019    Bess Harvest, PTA 01/28/20 12:32 PM   [facility] [place] [address]  [place], [place], [address] Phone: [phone]   Fax:  [phone]  Name: Calvin Castaneda MRN: [id number] Date of Birth: 30-Dec-2000  PHYSICAL THERAPY DISCHARGE SUMMARY  Visits from Start of Care: 13  Current functional level related to goals / functional outcomes: See above clinical impression; patient did not return during 30 day hold   Remaining deficits: Difficulty with stairs   Education / Equipment: HEP  Plan: Patient agrees to discharge.  Patient goals were partially met. Patient is being discharged due to being pleased with the current functional level.  ?????     Janene Harvey, PT, DPT 03/07/20 2:49 PM

## 2020-03-14 ENCOUNTER — Ambulatory Visit (INDEPENDENT_AMBULATORY_CARE_PROVIDER_SITE_OTHER): Payer: Self-pay

## 2020-03-14 ENCOUNTER — Ambulatory Visit (INDEPENDENT_AMBULATORY_CARE_PROVIDER_SITE_OTHER): Payer: Self-pay | Admitting: Orthopaedic Surgery

## 2020-03-14 ENCOUNTER — Other Ambulatory Visit: Payer: Self-pay

## 2020-03-14 ENCOUNTER — Encounter: Payer: Self-pay | Admitting: Orthopaedic Surgery

## 2020-03-14 DIAGNOSIS — S82402A Unspecified fracture of shaft of left fibula, initial encounter for closed fracture: Secondary | ICD-10-CM

## 2020-03-14 DIAGNOSIS — S82202A Unspecified fracture of shaft of left tibia, initial encounter for closed fracture: Secondary | ICD-10-CM

## 2020-03-14 NOTE — Progress Notes (Signed)
   Office Visit Note   Patient: Delma Villalva           Date of Birth: 11-Aug-2000           MRN: [id number] Visit Date: 03/14/2020              Requested by: Marlena Clipper, MD [address] [place],  [place] [address] PCP: Marlena Clipper, MD   Assessment & Plan: Visit Diagnoses:  1. Tibia/fibula fracture, left, closed, initial encounter     Plan: Impression is 5 months status post IM nail left tibia.  At this point he is released to activity as tolerated.  He has healed his fracture very well.  He will continue to increase activity as tolerated.  All questions answered.  Follow-up as needed.  Follow-Up Instructions: Return if symptoms worsen or fail to improve.   Orders:  Orders Placed This Encounter  Procedures  . XR Tibia/Fibula Left   No orders of the defined types were placed in this encounter.     Procedures: No procedures performed   Clinical Data: No additional findings.   Subjective: Chief Complaint  Patient presents with  . Left Leg - Follow-up    Jennifer is 5 months status post IM nail left tibia fracture.  Overall doing well.  No real complaints other than some easy fatigability and screw irritation proximally.  He has returned back to work.   Review of Systems  Constitutional: Negative.   All other systems reviewed and are negative.    Objective: Vital Signs: There were no vitals taken for this visit.  Physical Exam Vitals and nursing note reviewed.  Constitutional:      Appearance: He is well-developed.  Pulmonary:     Effort: Pulmonary effort is normal.  Abdominal:     Palpations: Abdomen is soft.  Skin:    General: Skin is warm.  Neurological:     Mental Status: He is alert and oriented to person, place, and time.  Psychiatric:        Behavior: Behavior normal.        Thought Content: Thought content normal.        Judgment: Judgment normal.     Ortho Exam Left lower extremity shows a fully healed surgical scars.  No  joint effusion.  Good range of motion of the ankle and the knee.  Ambulating normally.  No tenderness at the fracture site. Specialty Comments:  No specialty comments available.  Imaging: XR Tibia/Fibula Left  Result Date: 03/14/2020 Stable IM fixation of tib-fib fracture with abundant callus formation and bony consolidation.    PMFS History: Patient Active Problem List   Diagnosis Date Noted  . Tibia/fibula fracture, left, closed, initial encounter 10/14/2019   Past Medical History:  Diagnosis Date  . ADHD     History reviewed. No pertinent family history.  Past Surgical History:  Procedure Laterality Date  . TIBIA IM NAIL INSERTION Left 10/15/2019   Procedure: INTRAMEDULLARY (IM) NAIL TIBIAL;  Surgeon: Tarry Kos, MD;  Location: [facility];  Service: Orthopedics;  Laterality: Left;   Social History   Occupational History  . Not on file  Tobacco Use  . Smoking status: Never Smoker  . Smokeless tobacco: Never Used  Substance and Sexual Activity  . Alcohol use: No  . Drug use: No  . Sexual activity: Not on file

## 2021-02-06 IMAGING — CR DG TIBIA/FIBULA PORT 2V*L*
3 series · 3 of 3 positions shown · non-contrast
Comparison: October 14, 2019.

CLINICAL DATA: Status post intramedullary nail fixation.

EXAM:
PORTABLE LEFT TIBIA AND FIBULA - 2 VIEW

[AP (1 of 2)]
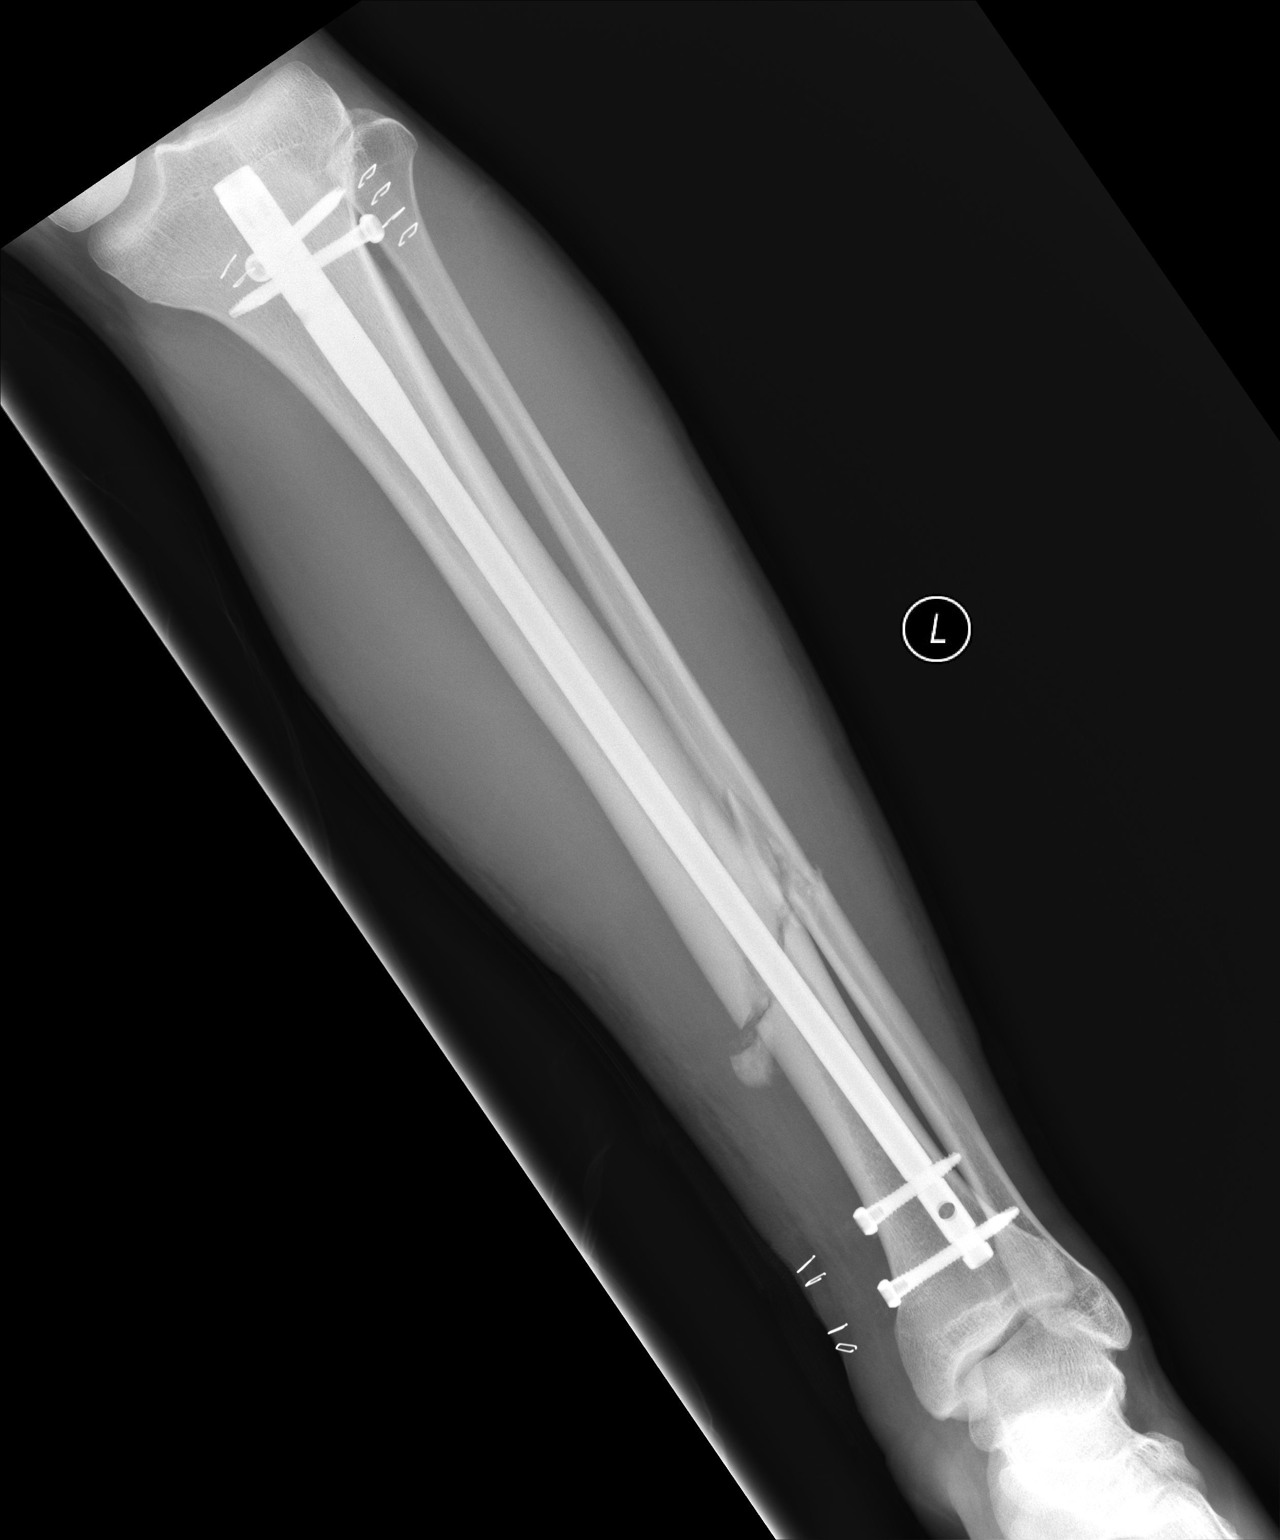

[AP (2 of 2)]
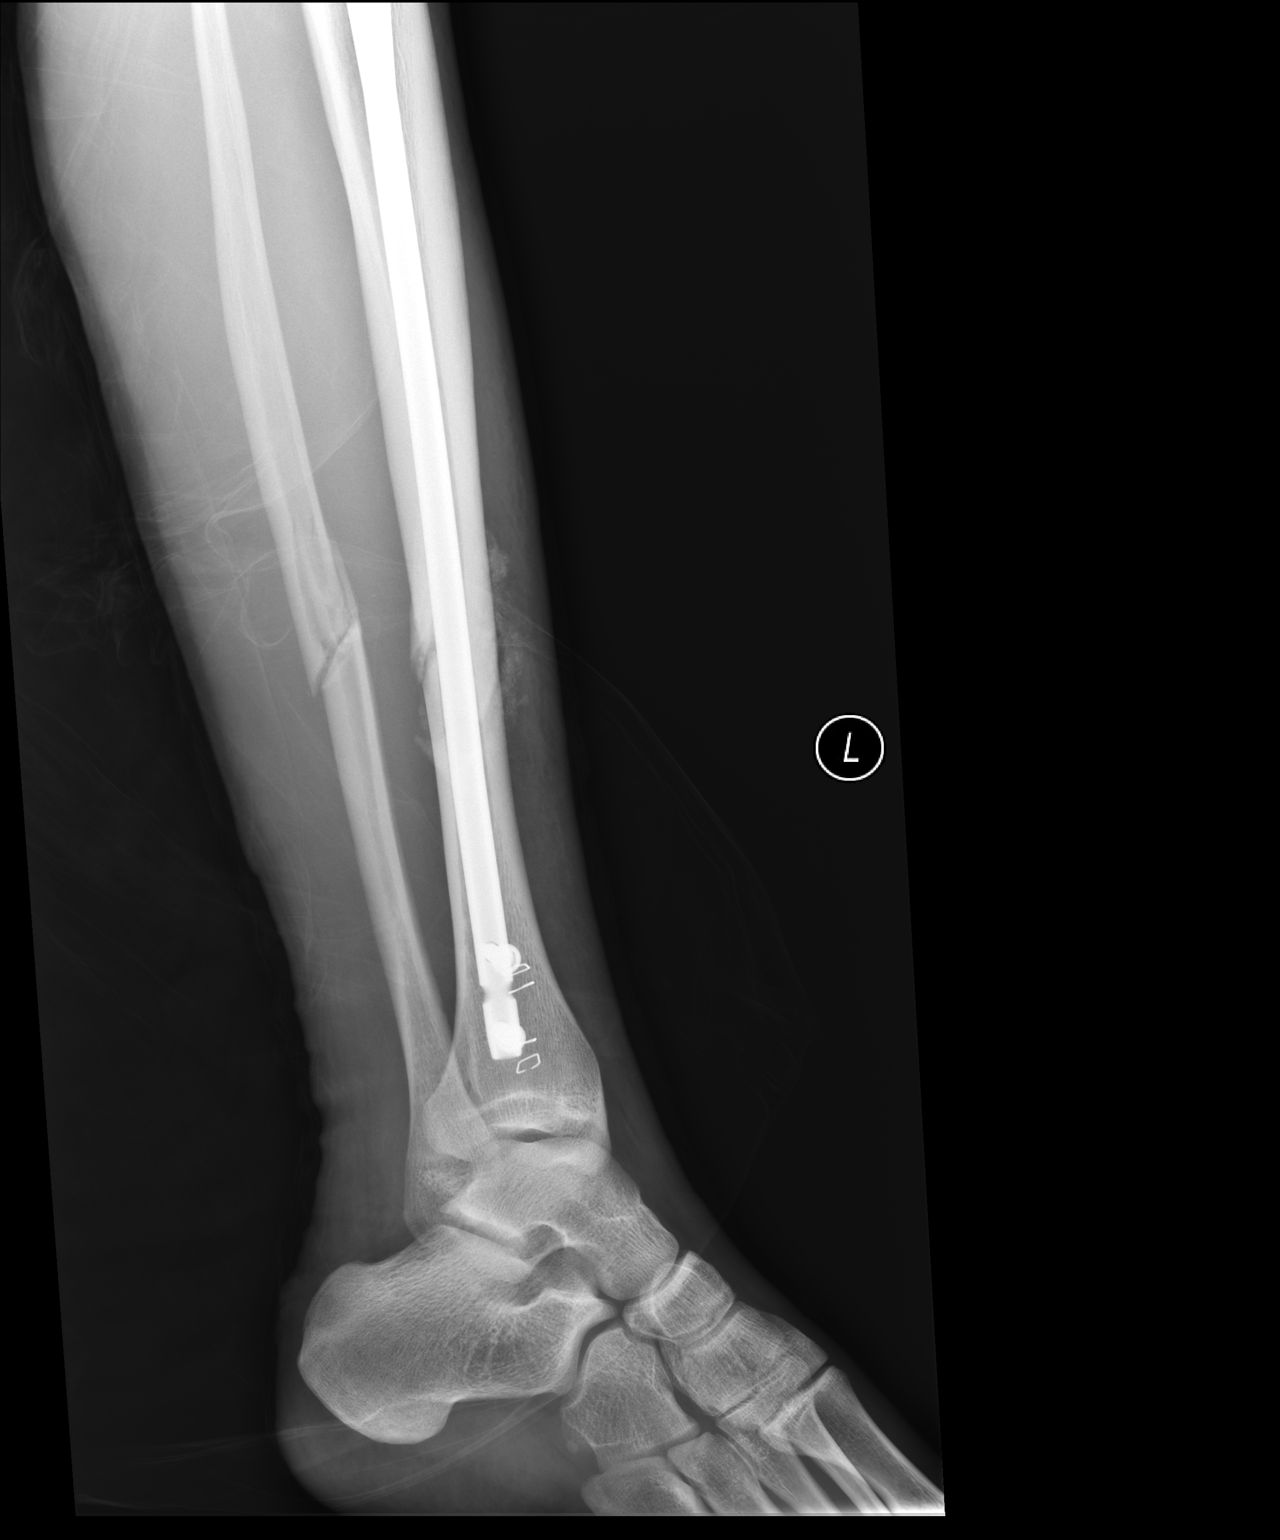

[lateral]
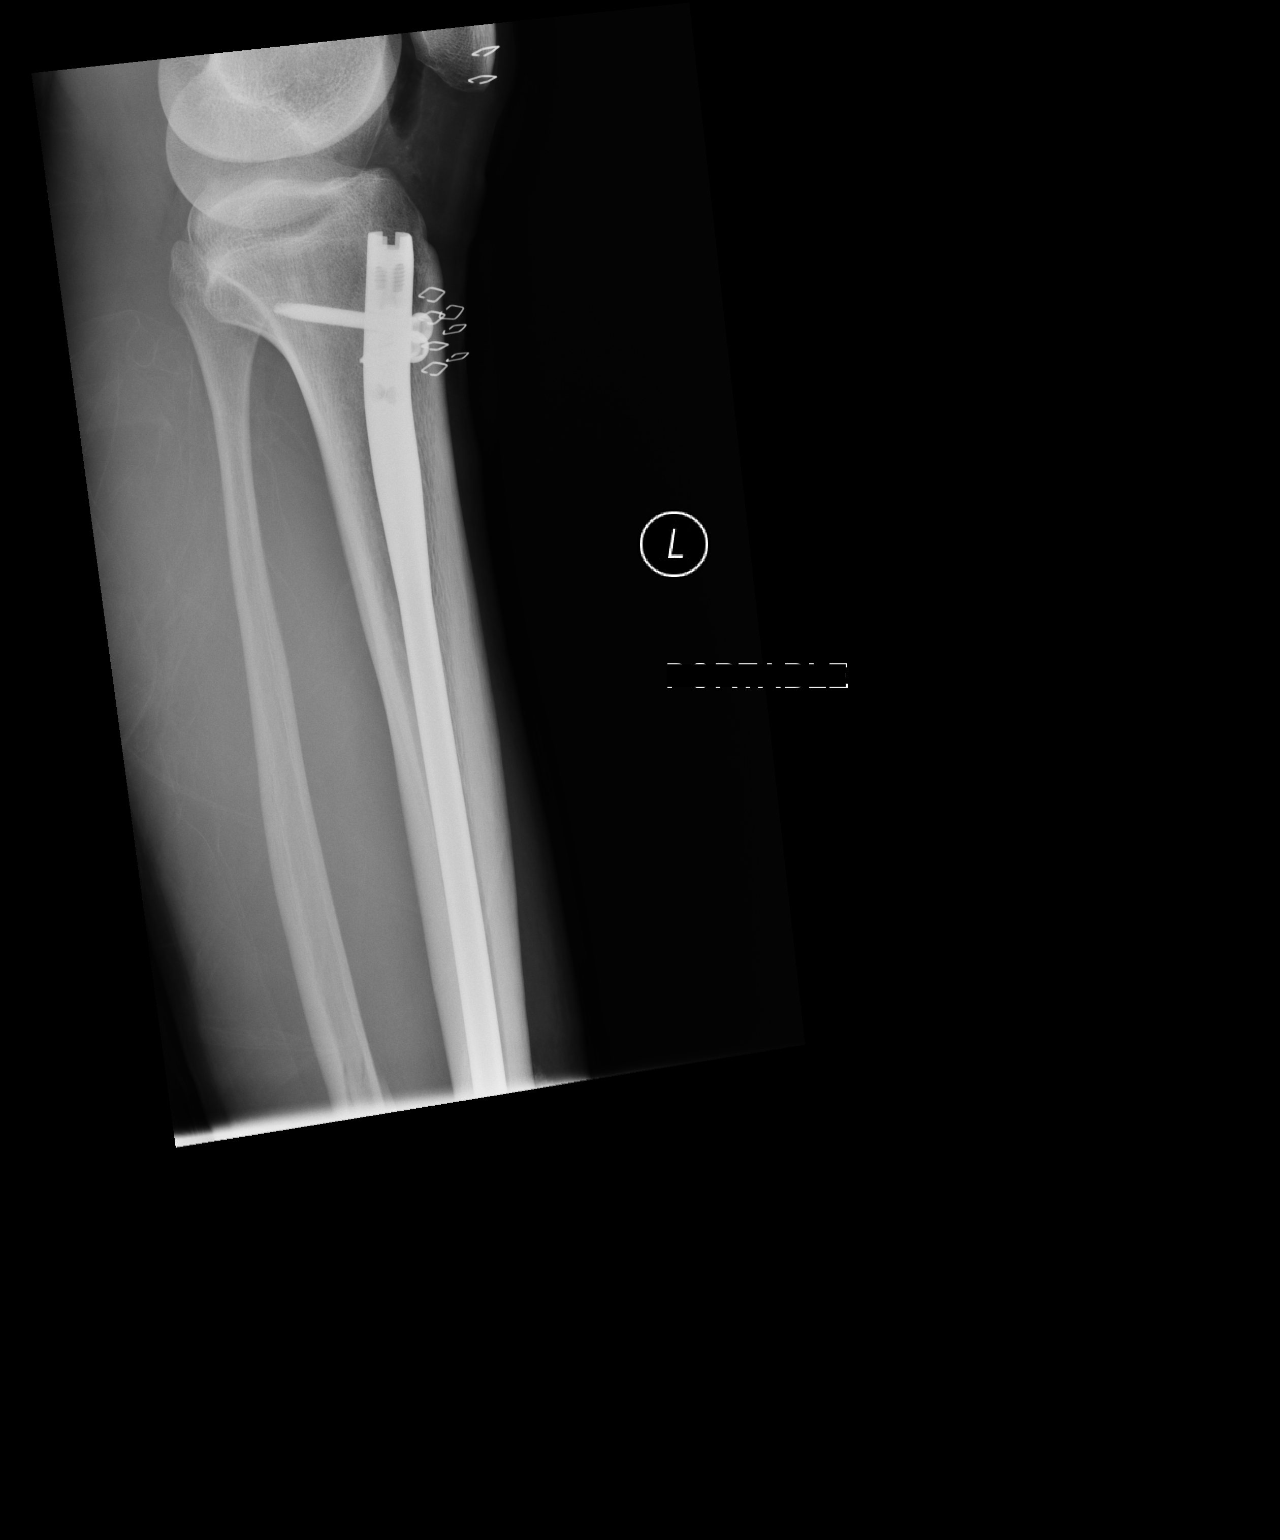

[3 of 3 positions shown; findings below may reference images not displayed]

FINDINGS: Status post intramedullary rod fixation of distal right tibial shaft
fracture. Good alignment of fracture components is noted. Improved
alignment of distal left fibular fracture is noted as well.
IMPRESSION: Status post intramedullary rod fixation of distal right tibial shaft
fracture with improved alignment of fracture components.

## 2021-06-15 ENCOUNTER — Other Ambulatory Visit: Payer: Self-pay

## 2021-06-15 ENCOUNTER — Ambulatory Visit: Payer: Self-pay

## 2021-06-15 ENCOUNTER — Encounter: Payer: Self-pay | Admitting: Orthopaedic Surgery

## 2021-06-15 ENCOUNTER — Ambulatory Visit (INDEPENDENT_AMBULATORY_CARE_PROVIDER_SITE_OTHER): Payer: 59 | Admitting: Orthopaedic Surgery

## 2021-06-15 DIAGNOSIS — M79662 Pain in left lower leg: Secondary | ICD-10-CM | POA: Diagnosis not present

## 2021-06-15 NOTE — Progress Notes (Signed)
Office Visit Note   Patient: Calvin Castaneda           Date of Birth: 10-22-2000           MRN: [id number] Visit Date: 06/15/2021              Requested by: Marlena Clipper, MD No address on file PCP: Marlena Clipper, MD   Assessment & Plan: Visit Diagnoses:  1. Pain in left lower leg     Plan: Based on my assessment I feel that he is experiencing overuse from the peroneals and long he likely has some adhesions from the prior injury.  I recommend that he modify his activity based on his symptoms but for the most part he really does not have any limitations.  Reassurance was provided that the fractures have fully healed and there are no hardware complications.  Questions encouraged and answered.  Follow-up as needed.  Follow-Up Instructions: No follow-ups on file.   Orders:  Orders Placed This Encounter  Procedures   XR Tibia/Fibula Left   No orders of the defined types were placed in this encounter.     Procedures: No procedures performed   Clinical Data: No additional findings.   Subjective: Chief Complaint  Patient presents with   Left Leg - Pain    HPI  Calvin Castaneda is a very pleasant 21 year old healthy gentleman who underwent an IM nail of the left tibia fracture in May 2021.  He did well from the surgery and healed his fractures.  He recently had some discomfort over the fibular fracture site with weight lifting and activity.  Denies any constitutional symptoms or injuries.  Review of Systems  Constitutional: Negative.   All other systems reviewed and are negative.   Objective: Vital Signs: There were no vitals taken for this visit.  Physical Exam Vitals and nursing note reviewed.  Constitutional:      Appearance: He is well-developed.  Pulmonary:     Effort: Pulmonary effort is normal.  Abdominal:     Palpations: Abdomen is soft.  Skin:    General: Skin is warm.  Neurological:     Mental Status: He is alert and oriented to person,  place, and time.  Psychiatric:        Behavior: Behavior normal.        Thought Content: Thought content normal.        Judgment: Judgment normal.    Ortho Exam  Examination of the left lower extremity shows fully healed surgical scars.  He has excellent range of motion of the knee without effusion.  He has some slight tenderness to the screw heads down towards the medial aspect of the ankle.  Range of motion of the ankle and knee does not produce any pain.  There is no pain with palpation of the fibular fracture site.  No palpable pain over the tibial fracture site.  Specialty Comments:  No specialty comments available.  Imaging: XR Tibia/Fibula Left  Result Date: 06/15/2021 Fully healed and consolidated tibia and fibula fractures.  No hardware complications.    PMFS History: Patient Active Problem List   Diagnosis Date Noted   Tibia/fibula fracture, left, closed, initial encounter 10/14/2019   Past Medical History:  Diagnosis Date   ADHD     No family history on file.  Past Surgical History:  Procedure Laterality Date   TIBIA IM NAIL INSERTION Left 10/15/2019   Procedure: INTRAMEDULLARY (IM) NAIL TIBIAL;  Surgeon: Tarry Kos, MD;  [facility];  Service: Orthopedics;  Laterality: Left;   Social History   Occupational History   Not on file  Tobacco Use   Smoking status: Never   Smokeless tobacco: Never  Substance and Sexual Activity   Alcohol use: No   Drug use: No   Sexual activity: Not on file

## 2023-01-16 ENCOUNTER — Telehealth: Payer: Self-pay | Admitting: Orthopaedic Surgery

## 2023-01-16 NOTE — Telephone Encounter (Signed)
Patient mother called and said he repaired his right knee and put in some screws but the screws are causing issues now. CB#[phone]

## 2023-01-28 ENCOUNTER — Ambulatory Visit: Payer: 59 | Admitting: Orthopaedic Surgery

## 2023-02-07 ENCOUNTER — Ambulatory Visit: Payer: 59 | Admitting: Orthopaedic Surgery
